# Patient Record
Sex: Male | Born: 1965 | Race: Black or African American | Hispanic: No | Marital: Married | Smoking: Never smoker
Health system: Southern US, Community
[De-identification: ages and names within clinical notes are randomized; demographics above are authoritative.]

## PROBLEM LIST (undated history)

## (undated) DIAGNOSIS — I10 Essential (primary) hypertension: Secondary | ICD-10-CM

## (undated) DIAGNOSIS — T7840XA Allergy, unspecified, initial encounter: Secondary | ICD-10-CM

## (undated) HISTORY — PX: ANKLE SURGERY: SHX546

## (undated) HISTORY — DX: Allergy, unspecified, initial encounter: T78.40XA

## (undated) HISTORY — DX: Essential (primary) hypertension: I10

---

## 2003-07-23 ENCOUNTER — Encounter: Admission: RE | Admit: 2003-07-23 | Discharge: 2003-07-23 | Payer: Self-pay | Admitting: Internal Medicine

## 2004-04-16 ENCOUNTER — Ambulatory Visit (HOSPITAL_COMMUNITY): Admission: RE | Admit: 2004-04-16 | Discharge: 2004-04-16 | Payer: Self-pay | Admitting: Chiropractic Medicine

## 2004-05-13 ENCOUNTER — Ambulatory Visit (HOSPITAL_COMMUNITY): Admission: RE | Admit: 2004-05-13 | Discharge: 2004-05-13 | Payer: Self-pay | Admitting: Chiropractic Medicine

## 2005-08-24 ENCOUNTER — Encounter: Admission: RE | Admit: 2005-08-24 | Discharge: 2005-08-24 | Payer: Self-pay | Admitting: Internal Medicine

## 2014-04-07 ENCOUNTER — Ambulatory Visit (INDEPENDENT_AMBULATORY_CARE_PROVIDER_SITE_OTHER): Payer: BLUE CROSS/BLUE SHIELD | Admitting: Internal Medicine

## 2014-04-07 VITALS — BP 142/84 | HR 65 | Temp 97.8°F | Resp 19 | Ht 76.75 in | Wt 224.8 lb

## 2014-04-07 DIAGNOSIS — I10 Essential (primary) hypertension: Secondary | ICD-10-CM

## 2014-04-07 DIAGNOSIS — H6123 Impacted cerumen, bilateral: Secondary | ICD-10-CM

## 2014-04-07 DIAGNOSIS — H9313 Tinnitus, bilateral: Secondary | ICD-10-CM

## 2014-04-07 NOTE — Progress Notes (Signed)
   Subjective:  This chart was scribed for Ellamae Siaobert Wyndell Cardiff, MD by Haywood PaoNadim Abu Hashem, ED Scribe at [facility].The patient was seen in exam room 02 and the patient's care was started at 1:58 PM.   Patient ID: Casey Salinas, male    DOB: March 22, 1965, 49 y.o.   MRN: [id number] Chief Complaint  Patient presents with  . Tinnitus    for 1 - 2 weeks   HPI  HPI Comments: Casey Salinas is a 49 y.o. male who [facility] complaining of ear ringing onset 1 week ago. The ringing is present in both ears but more so in his left. No fever, cold or hearing loss associated with his ear ringing  Patient Active Problem List   Diagnosis Date Noted  . HTN (hypertension) 04/07/2014   Past Medical History  Diagnosis Date  . Allergy    Past Surgical History  Procedure Laterality Date  . Ankle surgery Left    Allergies no known allergies Prior to Admission medications   Medication Sig Start Date End Date Taking? Authorizing Provider  losartan (COZAAR) 50 MG tablet Take 50 mg by mouth daily.   Yes Historical Provider, MD    Review of Systems  Constitutional: Negative for fever.  HENT: Positive for tinnitus. Negative for hearing loss.        Objective:   Physical Exam  Constitutional: He is oriented to person, place, and time. He appears well-developed and well-nourished. No distress.  HENT:  Head: Normocephalic and atraumatic.  Both auditory canals occluded with cerumen   Eyes: Pupils are equal, round, and reactive to light.  Neck: Normal range of motion.  Cardiovascular: Normal rate and regular rhythm.   Pulmonary/Chest: Effort normal. No respiratory distress.  Musculoskeletal: Normal range of motion.  Neurological: He is alert and oriented to person, place, and time.  Skin: Skin is warm and dry.  Psychiatric: He has a normal mood and affect. His behavior is normal.  Nursing note and vitals reviewed.  Post irrigation his tinnitus has resolved and both canals are  completely clear    Assessment & Plan:  Tinnitus, bilateral  Cerumen impaction, bilateral  Essential hypertension    I have completed the patient encounter in its entirety as documented by the scribe, with editing by me where necessary. Arlyn Buerkle P. Merla Richesoolittle, M.D.

## 2015-08-01 ENCOUNTER — Other Ambulatory Visit: Payer: Self-pay | Admitting: Internal Medicine

## 2015-08-01 ENCOUNTER — Ambulatory Visit
Admission: RE | Admit: 2015-08-01 | Discharge: 2015-08-01 | Disposition: A | Discharge status: Home / Self Care | Payer: BLUE CROSS/BLUE SHIELD | Source: Ambulatory Visit | Attending: Internal Medicine | Admitting: Internal Medicine

## 2015-08-01 DIAGNOSIS — R059 Cough, unspecified: Secondary | ICD-10-CM

## 2015-08-01 DIAGNOSIS — R05 Cough: Secondary | ICD-10-CM

## 2015-09-01 ENCOUNTER — Ambulatory Visit
Admission: RE | Admit: 2015-09-01 | Discharge: 2015-09-01 | Disposition: A | Discharge status: Home / Self Care | Payer: BLUE CROSS/BLUE SHIELD | Source: Ambulatory Visit | Attending: Internal Medicine | Admitting: Internal Medicine

## 2015-09-01 ENCOUNTER — Other Ambulatory Visit: Payer: Self-pay | Admitting: Internal Medicine

## 2015-09-01 DIAGNOSIS — R9389 Abnormal findings on diagnostic imaging of other specified body structures: Secondary | ICD-10-CM

## 2015-11-11 ENCOUNTER — Ambulatory Visit: Payer: BLUE CROSS/BLUE SHIELD | Admitting: Sports Medicine

## 2015-11-12 ENCOUNTER — Encounter: Payer: Self-pay | Admitting: Podiatry

## 2015-11-12 ENCOUNTER — Ambulatory Visit (INDEPENDENT_AMBULATORY_CARE_PROVIDER_SITE_OTHER): Payer: BLUE CROSS/BLUE SHIELD | Admitting: Podiatry

## 2015-11-12 VITALS — BP 149/110 | HR 66

## 2015-11-12 DIAGNOSIS — B351 Tinea unguium: Secondary | ICD-10-CM

## 2015-11-12 DIAGNOSIS — M722 Plantar fascial fibromatosis: Secondary | ICD-10-CM | POA: Diagnosis not present

## 2015-11-12 NOTE — Progress Notes (Signed)
Patient ID: Casey Salinas, male   DOB: Apr 18, 1965, 50 y.o.   MRN: [id number] Subjective: Active 50 year old male presents today for evaluation treatment of what he believes are fungal nails. 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 Patient presents today for evaluation of fungal nails as well as inquiring about custom sport orthotic   Objective: Physical Exam General: The patient is alert and oriented x3 in no acute distress.  Dermatology: Onychodystrophic, hyperkeratotic discolored nails 1 through 5 bilaterally as noted.   Skin is warm, dry and supple bilateral lower extremities. Negative for open lesions or macerations.  Vascular: Palpable pedal pulses bilaterally. No edema or erythema noted. Capillary refill within normal limits.  Neurological: Epicritic and protective threshold grossly intact bilaterally.   Musculoskeletal Exam: Range of motion within normal limits to all pedal and ankle joints bilateral. Muscle strength 5/5 in all groups bilateral.   Hallux valgus deformity is noted to the bilateral bunions more so on the left than the right with hammertoe contractures digits 2 through 5 bilaterally. 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 #2 Today fungal nail biopsy was taken of the nails and sent to pathology. #3 also the patient was scanned for custom molded accommodative sport orthotics #4 discussed conservative versus surgical management of bunion deformities as well as hammertoe  contractures-patient does not want surgery. #5 patient is to return to clinic in 4 weeks     Dr. Felecia ShellingBrent M. Monette Omara, DPM [facility]

## 2015-11-12 NOTE — Progress Notes (Signed)
Patient ID: Casey Salinas, male   DOB: [birth year], 50 y.o.   MRN: [id number]

## 2015-11-15 NOTE — Patient Instructions (Signed)

## 2015-12-15 ENCOUNTER — Ambulatory Visit (INDEPENDENT_AMBULATORY_CARE_PROVIDER_SITE_OTHER): Payer: BLUE CROSS/BLUE SHIELD | Admitting: Podiatry

## 2015-12-15 DIAGNOSIS — Z79899 Other long term (current) drug therapy: Secondary | ICD-10-CM

## 2015-12-15 DIAGNOSIS — B351 Tinea unguium: Secondary | ICD-10-CM | POA: Diagnosis not present

## 2015-12-15 MED ORDER — TERBINAFINE HCL 250 MG PO TABS: 250.0000 mg | ORAL_TABLET | Freq: Every day | ORAL | 2 refills | Status: DC

## 2015-12-16 LAB — HEPATIC FUNCTION PANEL
ALBUMIN: 4.2 g/dL (ref 3.6–5.1)
ALT: 18 U/L (ref 9–46)
AST: 18 U/L (ref 10–35)
Alkaline Phosphatase: 47 U/L (ref 40–115)
BILIRUBIN INDIRECT: 0.5 mg/dL (ref 0.2–1.2)
Bilirubin, Direct: 0.1 mg/dL (ref <=0.2)
TOTAL PROTEIN: 7.1 g/dL (ref 6.1–8.1)
Total Bilirubin: 0.6 mg/dL (ref 0.2–1.2)

## 2015-12-21 NOTE — Progress Notes (Signed)
Subjective: Patient presents today for orthotic pickup as well as evaluation and review of fungal nail biopsy results Patient presents today for possible treatment and evaluation of fungal nails bilaterally 1 through 5. Patient states that the nails have been discolored and thickened for greater than 1 month. Patient presents today for further treatment and evaluation.  Objective: Physical Exam General: The patient is alert and oriented x3 in no acute distress.  Dermatology: Hyperkeratotic, discolored, thickened, onychodystrophy of nails noted bilaterally.  Skin is warm, dry and supple bilateral lower extremities. Negative for open lesions or macerations.  Vascular: Palpable pedal pulses bilaterally. No edema or erythema noted. Capillary refill within normal limits.  Neurological: Epicritic and protective threshold grossly intact bilaterally.   Musculoskeletal Exam: Range of motion within normal limits to all pedal and ankle joints bilateral. Muscle strength 5/5 in all groups bilateral.   Assessment: #1 onychodystrophy bilateral toenails #2 possible onychomycosis #3 hyperkeratotic nails bilateral  Plan of Care:  #1 Patient was evaluated. Fungal results reviewed. #2 Orders for liver function tests were ordered today.  #3 prescription for antifungal Lamisil oral dispensed 3 months  #4 prescription for antifungal nail lacquer dispensed pharmacy #5 patient is to return to clinic in 4 months  Dr. Felecia ShellingBrent M. Evans, DPM [facility]

## 2016-04-12 ENCOUNTER — Ambulatory Visit: Payer: BLUE CROSS/BLUE SHIELD | Admitting: Podiatry

## 2016-04-21 ENCOUNTER — Ambulatory Visit: Payer: BLUE CROSS/BLUE SHIELD | Admitting: Podiatry

## 2017-04-29 IMAGING — CR DG CHEST 2V
2 series · 2 of 2 positions shown · non-contrast
Comparison: 08/01/2015

CLINICAL DATA: Follow-up chest x-ray

EXAM:
CHEST  2 VIEW

[w chest pa]
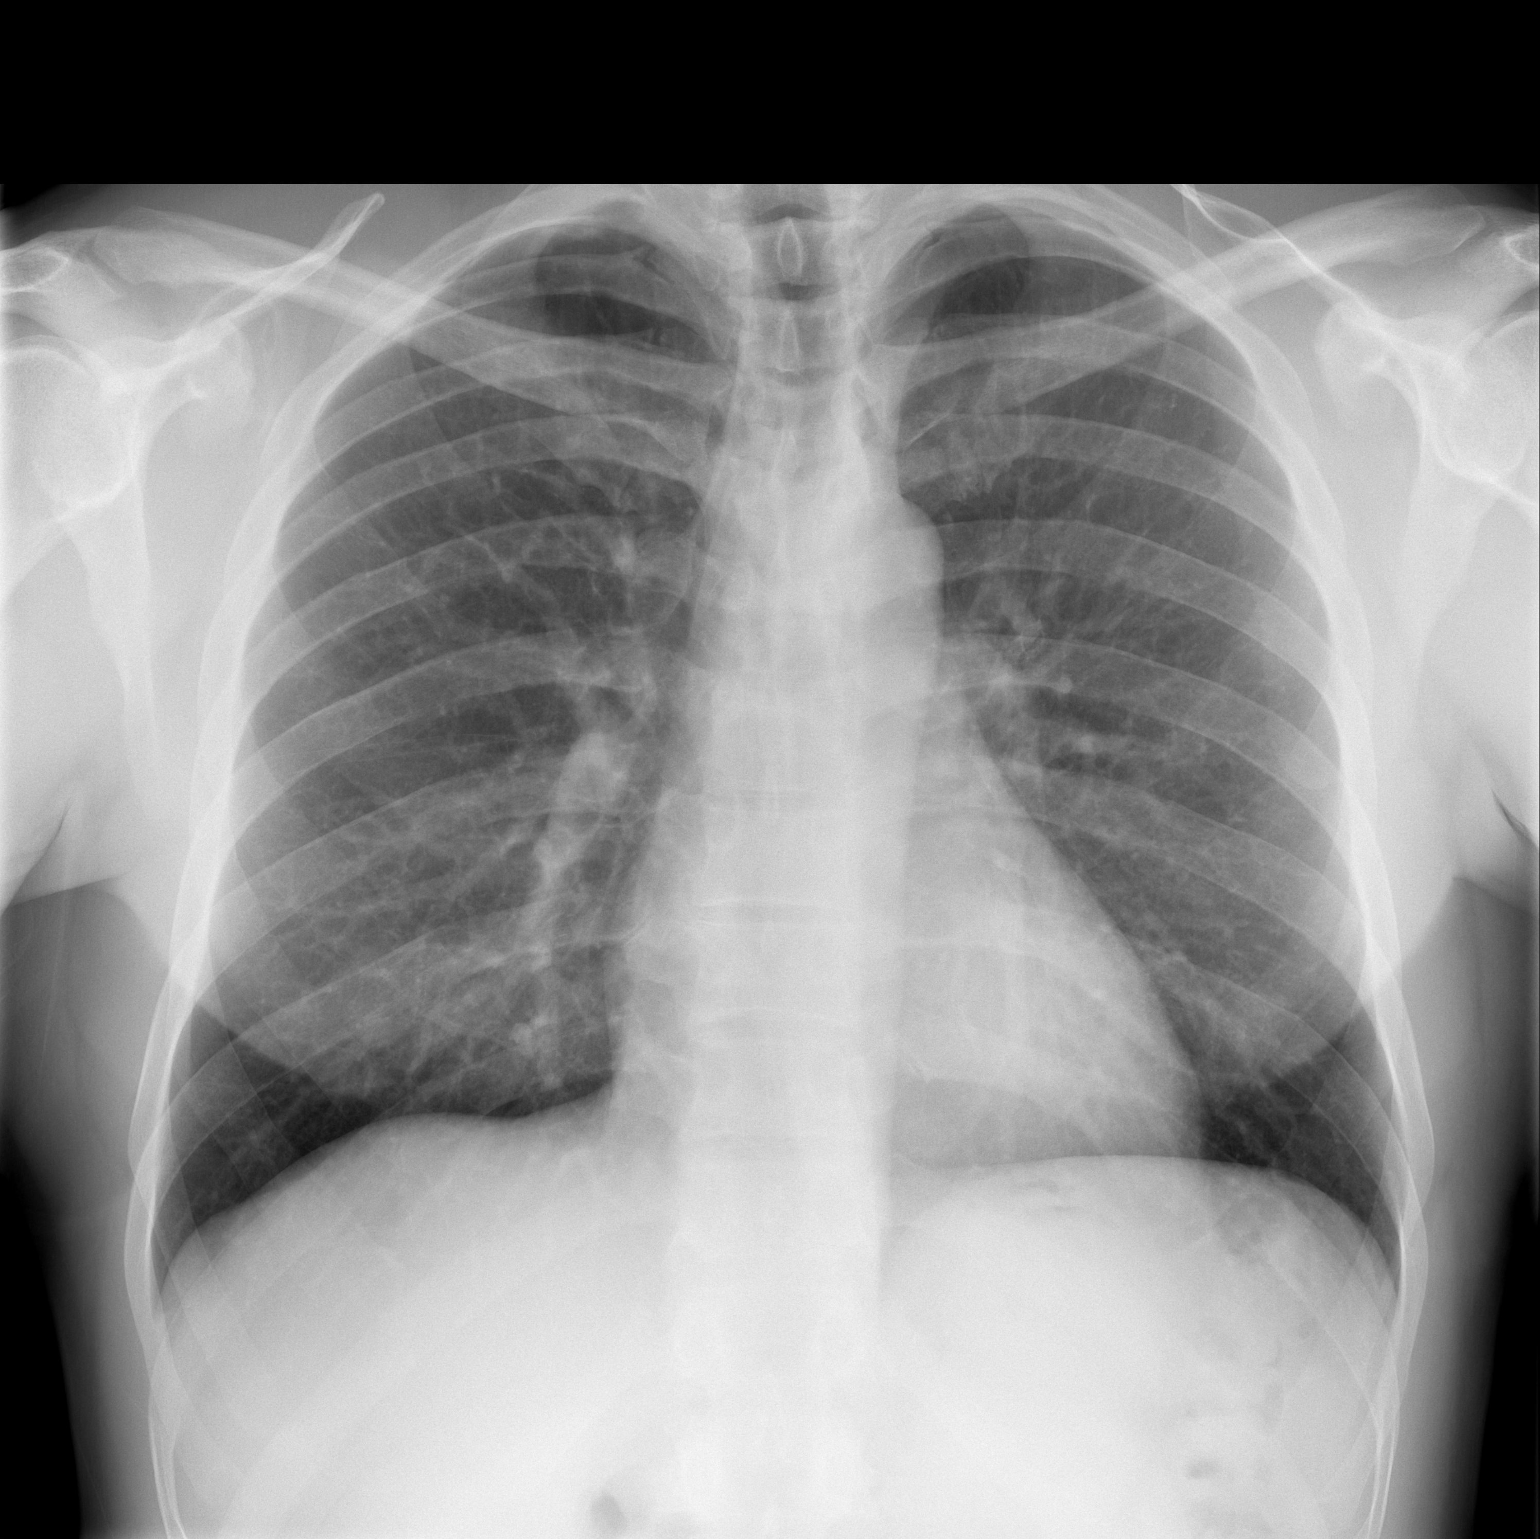

[w chest lat]
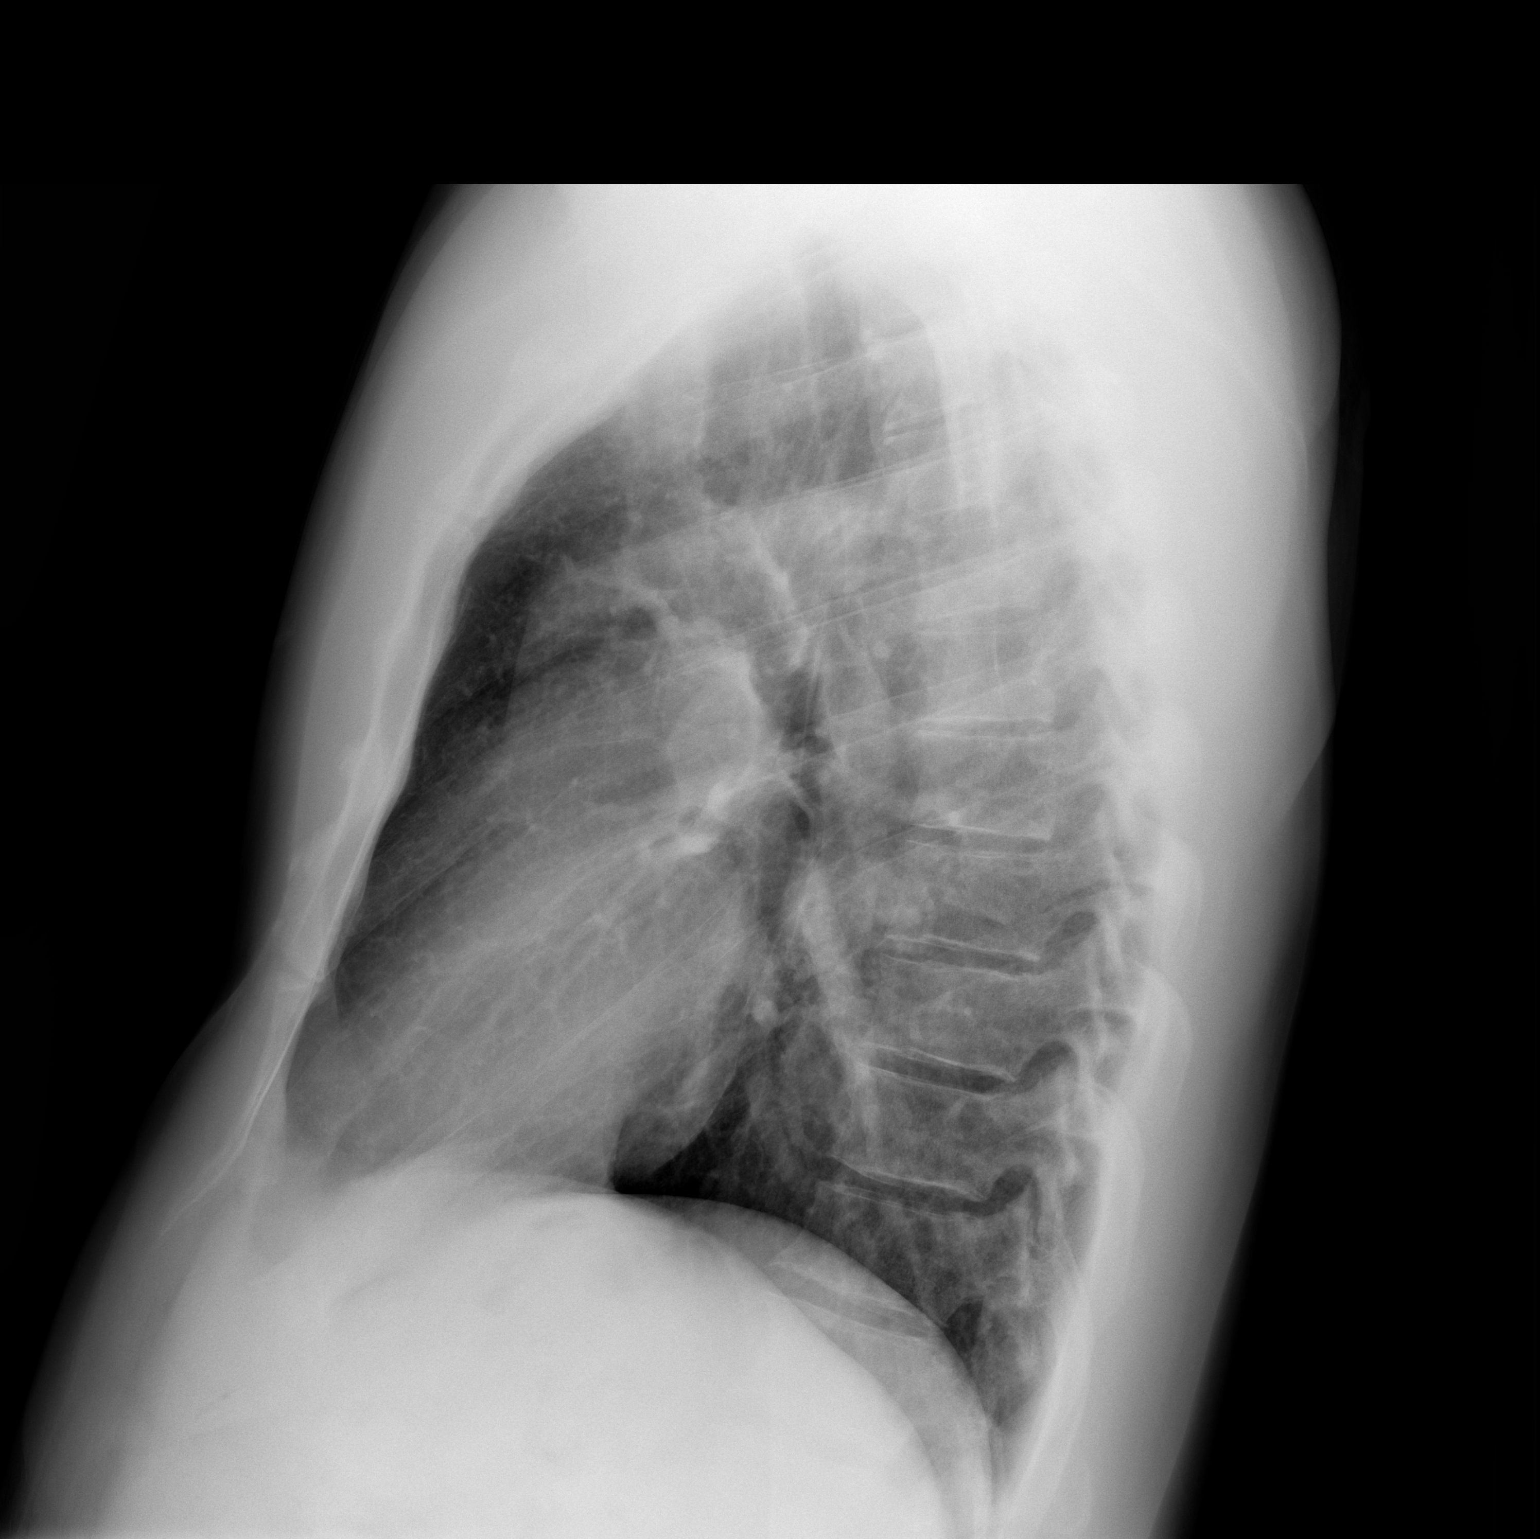

[2 of 2 positions shown; findings below may reference images not displayed]

FINDINGS: The heart size and mediastinal contours are within normal limits.
Both lungs are clear. The visualized skeletal structures are
unremarkable.
IMPRESSION: No active cardiopulmonary disease.

## 2020-06-23 NOTE — Progress Notes (Signed)
Date:  06/26/2020   ID:  Delia Chimes, DOB 09/14/1965, MRN [id number]  PCP:  Willey Blade, MD  Cardiologist:  Rex Kras, DO, [facility] (established care 06/26/2020)  REASON FOR CONSULT: Chest pain  REQUESTING PHYSICIAN:  Willey Blade, MD [address] [place],  [place] [address]  Chief Complaint  Patient presents with  . Chest Pain    HPI  Casey Salinas is a 55 y.o. male who presents to the office with a chief complaint of "chest pain." Patient's past medical history and cardiovascular risk factors include: Hypertension.   He is referred to the office at the request of Willey Blade, MD for evaluation of chest pain.  Patient states that he had an episode of chest pain approximately 3 weeks ago.  Patient states that several days after going to the gym he experienced pleuritic chest pain localized to the left anterior chest wall radiating to his left collarbone.  The symptoms lasted for less than a few minutes, not brought on by effort related activities and did not resolve with rest.  He had mentioned this to his primary care provider and given his other cardiac risk factors was referred to cardiology for further evaluation and management.  Patient's blood pressure at today's office visit are not well controlled.  I asked him what his home blood pressure readings are and patient states that they are usually high and thinks that the blood pressure cuff is not working correctly.  So he goes to the [facility] prior to working out in the morning he checks his blood pressure there and it is usually around 153/83 before exercise and after working out his blood pressures are usually around 120/73.  Patient currently does not have any active chest pain.  He has not had any reoccurrence since that 1 episode.  He is overall euvolemic and not in congestive heart failure.  No family history of premature CAD or sudden cardiac death/cardiomyopathy.  FUNCTIONAL STATUS: Goes  to gym 3-4 x per week for 35mns and does 122m cardio and than resistance training.    ALLERGIES: Allergies  Allergen Reactions  . Shrimp [Shellfish Allergy] Rash    MEDICATION LIST PRIOR TO VISIT: Current Meds  Medication Sig  . amLODipine (NORVASC) 10 MG tablet Take 1 tablet (10 mg total) by mouth daily.  . Cholecalciferol (VITAMIN D3) 250 MCG (10000 UT) capsule Take 10,000 Units by mouth daily.  . Magnesium Bisglycinate 100 MG TABS Take 200 mg by mouth daily.  . Multiple Vitamin (MULTIVITAMIN) tablet Take 1 tablet by mouth daily.  . Marland Kitchenmega-3 acid ethyl esters (LOVAZA) 1 g capsule Take 1 g by mouth daily.  . valsartan (DIOVAN) 160 MG tablet Take 160 mg by mouth daily.     PAST MEDICAL HISTORY: Past Medical History:  Diagnosis Date  . Allergy   . Hypertension     PAST SURGICAL HISTORY: Past Surgical History:  Procedure Laterality Date  . ANKLE SURGERY Left     FAMILY HISTORY: The patient family history includes Cirrhosis in his father; Diabetes in his mother; Hypertension in his father and mother.  SOCIAL HISTORY:  The patient  reports that he has never smoked. He has never used smokeless tobacco. He reports current alcohol use. He reports that he does not use drugs.  REVIEW OF SYSTEMS: Review of Systems  Constitutional: Negative for chills and fever.  HENT: Negative for hoarse voice and nosebleeds.   Eyes: Negative for discharge, double vision and pain.  Cardiovascular: Negative  for chest pain, claudication, dyspnea on exertion, leg swelling, near-syncope, orthopnea, palpitations, paroxysmal nocturnal dyspnea and syncope.  Respiratory: Negative for hemoptysis and shortness of breath.   Musculoskeletal: Negative for muscle cramps and myalgias.  Gastrointestinal: Negative for abdominal pain, constipation, diarrhea, hematemesis, hematochezia, melena, nausea and vomiting.  Neurological: Negative for dizziness and light-headedness.    PHYSICAL EXAM: Vitals with BMI  06/26/2020 06/26/2020 11/12/2015  Height - 6' 6"  -  Weight - 225 lbs -  BMI - 53.61 -  Systolic 443 154 008  Diastolic 90 79 676  Pulse 75 65 66    CONSTITUTIONAL: Well-developed and well-nourished. No acute distress.  SKIN: Skin is warm and dry. No rash noted. No cyanosis. No pallor. No jaundice HEAD: Normocephalic and atraumatic.  EYES: No scleral icterus MOUTH/THROAT: Moist oral membranes.  NECK: No JVD present. No thyromegaly noted. No carotid bruits  LYMPHATIC: No visible cervical adenopathy.  CHEST Normal respiratory effort. No intercostal retractions  LUNGS: Clear to auscultation bilaterally. No stridor. No wheezes. No rales.  CARDIOVASCULAR: Regular rate and rhythm, positive S1-S2, no murmurs rubs or gallops appreciated. ABDOMINAL: No apparent ascites.  EXTREMITIES: No peripheral edema  HEMATOLOGIC: No significant bruising NEUROLOGIC: Oriented to person, place, and time. Nonfocal. Normal muscle tone.  PSYCHIATRIC: Normal mood and affect. Normal behavior. Cooperative  CARDIAC DATABASE: EKG: 06/26/2020: Sinus bradycardia, 58 bpm, normal axis, first-degree AV block, ST changes due to early repolarization.  Echocardiogram: No results found for this or any previous visit from the past 1095 days.    Stress Testing: No results found for this or any previous visit from the past 1095 days.   Heart Catheterization: None  LABORATORY DATA: No flowsheet data found.  CMP Latest Ref Rng & Units 12/15/2015  Total Protein 6.1 - 8.1 g/dL 7.1  Total Bilirubin 0.2 - 1.2 mg/dL 0.6  Alkaline Phos 40 - 115 U/L 47  AST 10 - 35 U/L 18  ALT 9 - 46 U/L 18    Lipid Panel  No results found for: CHOL, TRIG, HDL, CHOLHDL, VLDL, LDLCALC, LDLDIRECT, LABVLDL  No components found for: NTPROBNP No results for input(s): PROBNP in the last 8760 hours. No results for input(s): TSH in the last 8760 hours.  BMP No results for input(s): NA, K, CL, CO2, GLUCOSE, BUN, CREATININE, CALCIUM,  GFRNONAA, GFRAA in the last 8760 hours.  HEMOGLOBIN A1C No results found for: HGBA1C, MPG   External Labs:  Date Collected: 10/03/2019 , information obtained by PCP Potassium: 4.4 Creatinine 1.15 mg/dL. eGFR: 83 mL/min per 1.73 m Hemoglobin: 17.2 g/dL and hematocrit: 52.8 % Lipid profile: Total cholesterol 184 , triglycerides 95 , HDL 64 , LDL 103 AST: 19 , ALT: 20 , alkaline phosphatase: 54  Hemoglobin A1c: 5.7 TSH: 1.810   IMPRESSION:    ICD-10-CM   1. Precordial pain  R07.2 PCV CARDIAC STRESS TEST    PCV ECHOCARDIOGRAM COMPLETE    SARS-COV-2 RNA,(COVID-19) QUAL NAAT    EKG 12-Lead  2. Benign essential hypertension  I10 amLODipine (NORVASC) 10 MG tablet     RECOMMENDATIONS: Casey Salinas is a 55 y.o. male whose past medical history and cardiac risk factors include: Hypertension.  Precordial pain: Patient symptoms of chest pain appears to be noncardiac in etiology. EKG today shows normal sinus rhythm without underlying injury pattern.  He does have concave upsloping ST changes suggestive of early repolarization. No active chest pain or anginal equivalent. Plan echocardiogram to evaluate for structural heart disease. Exercise treadmill stress test to evaluate  for functional status and exercise-induced ischemia.  Benign essential hypertension: Not well controlled. Recommend a systolic blood pressure goal between 120-130 mmHg. Patient does not have an upcoming office visit with his PCP.  Therefore in the interim we will add amlodipine 10 mg p.o. daily until he gets a chance to see his primary provider. Low-salt diet recommended Patient is asked to keep a log of his blood pressures twice a day and to review it with either myself or his PCP  FINAL MEDICATION LIST END OF ENCOUNTER: Meds ordered this encounter  Medications  . amLODipine (NORVASC) 10 MG tablet    Sig: Take 1 tablet (10 mg total) by mouth daily.    Dispense:  90 tablet    Refill:  0    There are no  discontinued medications.   Current Outpatient Medications:  .  amLODipine (NORVASC) 10 MG tablet, Take 1 tablet (10 mg total) by mouth daily., Disp: 90 tablet, Rfl: 0 .  Cholecalciferol (VITAMIN D3) 250 MCG (10000 UT) capsule, Take 10,000 Units by mouth daily., Disp: , Rfl:  .  Magnesium Bisglycinate 100 MG TABS, Take 200 mg by mouth daily., Disp: , Rfl:  .  Multiple Vitamin (MULTIVITAMIN) tablet, Take 1 tablet by mouth daily., Disp: , Rfl:  .  omega-3 acid ethyl esters (LOVAZA) 1 g capsule, Take 1 g by mouth daily., Disp: , Rfl:  .  valsartan (DIOVAN) 160 MG tablet, Take 160 mg by mouth daily., Disp: , Rfl:  .  losartan (COZAAR) 50 MG tablet, Take 50 mg by mouth daily. (Patient not taking: Reported on 06/26/2020), Disp: , Rfl:  .  terbinafine (LAMISIL) 250 MG tablet, Take 1 tablet (250 mg total) by mouth daily. (Patient not taking: Reported on 06/26/2020), Disp: 30 tablet, Rfl: 2  Orders Placed This Encounter  Procedures  . SARS-COV-2 RNA,(COVID-19) QUAL NAAT  . PCV CARDIAC STRESS TEST  . EKG 12-Lead  . PCV ECHOCARDIOGRAM COMPLETE    There are no Patient Instructions on file for this visit.   --Continue cardiac medications as reconciled in final medication list. --Return in about 4 weeks (around 07/24/2020) for Follow up, BP. Or sooner if needed. --Continue follow-up with your primary care physician regarding the management of your other chronic comorbid conditions.  Patient's questions and concerns were addressed to his satisfaction. He voices understanding of the instructions provided during this encounter.   This note was created using a voice recognition software as a result there may be grammatical errors inadvertently enclosed that do not reflect the nature of this encounter. Every attempt is made to correct such errors.  Rex Kras, [place], [facility]  Pager: [phone] Office: [phone]

## 2020-06-26 ENCOUNTER — Encounter: Payer: Self-pay | Admitting: Cardiology

## 2020-06-26 ENCOUNTER — Ambulatory Visit: Payer: 59 | Admitting: Cardiology

## 2020-06-26 VITALS — BP 155/90 | HR 75 | Temp 97.9°F | Resp 11 | Ht 78.0 in | Wt 225.0 lb

## 2020-06-26 DIAGNOSIS — I10 Essential (primary) hypertension: Secondary | ICD-10-CM

## 2020-06-26 DIAGNOSIS — R072 Precordial pain: Secondary | ICD-10-CM

## 2020-06-26 MED ORDER — AMLODIPINE BESYLATE 10 MG PO TABS: 10.0000 mg | ORAL_TABLET | Freq: Every day | ORAL | 0 refills | Status: DC

## 2020-06-27 ENCOUNTER — Other Ambulatory Visit: Payer: Self-pay | Admitting: Cardiology

## 2020-06-27 DIAGNOSIS — I10 Essential (primary) hypertension: Secondary | ICD-10-CM

## 2020-07-09 ENCOUNTER — Other Ambulatory Visit (HOSPITAL_COMMUNITY)
Admission: RE | Admit: 2020-07-09 | Discharge: 2020-07-09 | Disposition: A | Discharge status: Home / Self Care | Payer: Self-pay | Source: Ambulatory Visit | Attending: Cardiology | Admitting: Cardiology

## 2020-07-09 DIAGNOSIS — Z20822 Contact with and (suspected) exposure to covid-19: Secondary | ICD-10-CM | POA: Insufficient documentation

## 2020-07-09 DIAGNOSIS — Z01812 Encounter for preprocedural laboratory examination: Secondary | ICD-10-CM | POA: Insufficient documentation

## 2020-07-09 LAB — SARS CORONAVIRUS 2 (TAT 6-24 HRS): SARS Coronavirus 2: NEGATIVE

## 2020-07-11 ENCOUNTER — Ambulatory Visit: Payer: 59

## 2020-07-11 ENCOUNTER — Other Ambulatory Visit: Payer: Self-pay

## 2020-07-11 DIAGNOSIS — R072 Precordial pain: Secondary | ICD-10-CM

## 2020-07-14 ENCOUNTER — Ambulatory Visit: Payer: 59

## 2020-07-14 ENCOUNTER — Other Ambulatory Visit: Payer: Self-pay

## 2020-07-14 DIAGNOSIS — R072 Precordial pain: Secondary | ICD-10-CM

## 2020-07-17 NOTE — Progress Notes (Signed)
Called and spoke with patient regarding his Echocardiogram results.

## 2020-08-18 ENCOUNTER — Ambulatory Visit: Payer: 59 | Admitting: Cardiology

## 2020-08-18 ENCOUNTER — Other Ambulatory Visit: Payer: Self-pay

## 2020-08-18 ENCOUNTER — Encounter: Payer: Self-pay | Admitting: Cardiology

## 2020-08-18 VITALS — BP 136/72 | HR 83 | Temp 98.3°F | Resp 16 | Ht 78.0 in | Wt 222.0 lb

## 2020-08-18 DIAGNOSIS — R072 Precordial pain: Secondary | ICD-10-CM

## 2020-08-18 DIAGNOSIS — R9439 Abnormal result of other cardiovascular function study: Secondary | ICD-10-CM

## 2020-08-18 DIAGNOSIS — I10 Essential (primary) hypertension: Secondary | ICD-10-CM

## 2020-08-18 NOTE — Progress Notes (Signed)
ID:  Casey Salinas, DOB Jul 09, 1965, MRN [id number]  PCP:  Willey Blade, MD  Cardiologist:  Rex Kras, DO, [facility] (established care 06/26/2020)  Date: 08/18/2020 Last Office Visit: 06/26/2020  Chief Complaint  Patient presents with   Precordial pain   Follow-up    HPI  Casey Salinas is a 55 y.o. male who presents to the office with a chief complaint of "reevaluation of chest pain and review test results" Patient's past medical history and cardiovascular risk factors include: Hypertension.   He is referred to the office at the request of Willey Blade, MD for evaluation of chest pain.  He was referred to the office for evaluation of precordial pain which appeared to be noncardiac in etiology.  And therefore was recommended to undergo an echocardiogram and GXT for further risk stratification.  Echocardiogram notes preserved LVEF, normal diastolic filling pattern, and no significant valvular heart disease.  GXT noted good functional capacity for age but overall equivocal and intermediate risk study.  Clinically, patient states that he does not have chest pain at rest or with effort related activities.  He is overall euvolemic and not in congestive heart failure.  No family history of premature CAD or sudden cardiac death/cardiomyopathy.   FUNCTIONAL STATUS: Goes to gym 3-4 x per week for 73mns and does 135m cardio and than resistance training.    ALLERGIES: Allergies  Allergen Reactions   Shrimp [Shellfish Allergy] Rash    MEDICATION LIST PRIOR TO VISIT: Current Meds  Medication Sig   amLODipine (NORVASC) 10 MG tablet TAKE 1 TABLET(10 MG) BY MOUTH DAILY   cetirizine (ZYRTEC) 10 MG tablet Take 1 tablet by mouth as needed.   Cholecalciferol (VITAMIN D3) 250 MCG (10000 UT) capsule Take 10,000 Units by mouth daily.   ibuprofen (ADVIL) 600 MG tablet Take 1 tablet by mouth as needed.   Magnesium Bisglycinate 100 MG TABS Take 200 mg by mouth daily.   Multiple  Vitamin (MULTIVITAMIN) tablet Take 1 tablet by mouth daily.   omega-3 acid ethyl esters (LOVAZA) 1 g capsule Take 1 g by mouth daily.   valsartan (DIOVAN) 160 MG tablet Take 160 mg by mouth daily.     PAST MEDICAL HISTORY: Past Medical History:  Diagnosis Date   Allergy    Hypertension     PAST SURGICAL HISTORY: Past Surgical History:  Procedure Laterality Date   ANKLE SURGERY Left     FAMILY HISTORY: The patient family history includes Cirrhosis in his father; Diabetes in his mother; Hypertension in his father and mother.  SOCIAL HISTORY:  The patient  reports that he has never smoked. He has never used smokeless tobacco. He reports current alcohol use. He reports that he does not use drugs.  REVIEW OF SYSTEMS: Review of Systems  Constitutional: Negative for chills and fever.  HENT:  Negative for hoarse voice and nosebleeds.   Eyes:  Negative for discharge, double vision and pain.  Cardiovascular:  Negative for chest pain, claudication, dyspnea on exertion, leg swelling, near-syncope, orthopnea, palpitations, paroxysmal nocturnal dyspnea and syncope.  Respiratory:  Negative for hemoptysis and shortness of breath.   Musculoskeletal:  Negative for muscle cramps and myalgias.  Gastrointestinal:  Negative for abdominal pain, constipation, diarrhea, hematemesis, hematochezia, melena, nausea and vomiting.  Neurological:  Negative for dizziness and light-headedness.   PHYSICAL EXAM: Vitals with BMI 08/18/2020 06/26/2020 06/26/2020  Height _0  - _1   Weight 222 lbs - 225 lbs  BMI 2575.91 2663.84Systolic 136655993  676  Diastolic 72 90 79  Pulse 83 75 65    CONSTITUTIONAL: Well-developed and well-nourished. No acute distress.  SKIN: Skin is warm and dry. No rash noted. No cyanosis. No pallor. No jaundice HEAD: Normocephalic and atraumatic.  EYES: No scleral icterus MOUTH/THROAT: Moist oral membranes.  NECK: No JVD present. No thyromegaly noted. No carotid bruits  LYMPHATIC:  No visible cervical adenopathy.  CHEST Normal respiratory effort. No intercostal retractions  LUNGS: Clear to auscultation bilaterally. No stridor. No wheezes. No rales.  CARDIOVASCULAR: Regular rate and rhythm, positive S1-S2, no murmurs rubs or gallops appreciated. ABDOMINAL: No apparent ascites.  EXTREMITIES: No peripheral edema  HEMATOLOGIC: No significant bruising NEUROLOGIC: Oriented to person, place, and time. Nonfocal. Normal muscle tone.  PSYCHIATRIC: Normal mood and affect. Normal behavior. Cooperative  CARDIAC DATABASE: EKG: 06/26/2020: Sinus bradycardia, 58 bpm, normal axis, first-degree AV block, ST changes due to early repolarization.  Echocardiogram: 07/14/2020: Left ventricle cavity is normal in size and wall thickness. Normal global wall motion. Normal LV systolic function with EF 60%. Normal diastolic filling pattern.  No significant valvular abnormality. No evidence of pulmonary hypertension.   Stress Testing: Exercise treadmill stress test 07/11/2020: Functional status: Good. Chest pain: No. Reason for stopping exercise: fatigue  / weakness.  Hypertensive response to exercise: No. Exercised for 09 minutes 02 seconds on Bruce protocol, achieved 10.18 METS, and 95% of APMHR.  Stress ECG equivocal for ischemia (stress ECG notes 63m horizontal ST depression in inferior leads at peak rest which resolves by 2 minutes into recovery).   Intermediate risk study- clinical correlation is requested.   Heart Catheterization: None  LABORATORY DATA: No flowsheet data found.  CMP Latest Ref Rng & Units 12/15/2015  Total Protein 6.1 - 8.1 g/dL 7.1  Total Bilirubin 0.2 - 1.2 mg/dL 0.6  Alkaline Phos 40 - 115 U/L 47  AST 10 - 35 U/L 18  ALT 9 - 46 U/L 18    Lipid Panel  No results found for: CHOL, TRIG, HDL, CHOLHDL, VLDL, LDLCALC, LDLDIRECT, LABVLDL  No components found for: NTPROBNP No results for input(s): PROBNP in the last 8760 hours. No results for input(s): TSH  in the last 8760 hours.  BMP No results for input(s): NA, K, CL, CO2, GLUCOSE, BUN, CREATININE, CALCIUM, GFRNONAA, GFRAA in the last 8760 hours.  HEMOGLOBIN A1C No results found for: HGBA1C, MPG   External Labs:  Date Collected: 10/03/2019 , information obtained by PCP Potassium: 4.4 Creatinine 1.15 mg/dL. eGFR: 83 mL/min per 1.73 m Hemoglobin: 17.2 g/dL and hematocrit: 52.8 % Lipid profile: Total cholesterol 184 , triglycerides 95 , HDL 64 , LDL 103 AST: 19 , ALT: 20 , alkaline phosphatase: 54  Hemoglobin A1c: 5.7 TSH: 1.810   IMPRESSION:    ICD-10-CM   1. Equivocal stress test  R94.39 PCV MYOCARDIAL PERFUSION WO LEXISCAN    2. Precordial pain  R07.2     3. Benign essential hypertension  I10        RECOMMENDATIONS: Casey SCHLIEPis a 55y.o. male whose past medical history and cardiac risk factors include: Hypertension.  Given his symptoms of precordial pain he underwent echocardiogram and GXT.  Results reviewed with him in great detail and noted above for further reference.  The GXT study was reported to be an intermediate risk due to stress ECG being equivocal.  We discussed continuing to improve his modifiable cardiovascular risk factors and to reevaluate if his symptoms of precordial discomfort resurfaces.  However, patient states that  he rather undergo additional testing to make sure he does not have CAD.  We discussed exercise nuclear stress test versus coronary CTA.  Patient would like to proceed with exercise nuclear stress test at this time.  Patient's blood pressure at today's office visit is better compared to prior visits.  Medications reconciled.  Recommend a systolic blood pressure goal of approximately 120 mmHg.  Currently managed by primary care provider.  Low-salt diet recommended.  FINAL MEDICATION LIST END OF ENCOUNTER: No orders of the defined types were placed in this encounter.   Medications Discontinued During This Encounter  Medication Reason    losartan (COZAAR) 50 MG tablet Error   terbinafine (LAMISIL) 250 MG tablet Error     Current Outpatient Medications:    amLODipine (NORVASC) 10 MG tablet, TAKE 1 TABLET(10 MG) BY MOUTH DAILY, Disp: 90 tablet, Rfl: 0   cetirizine (ZYRTEC) 10 MG tablet, Take 1 tablet by mouth as needed., Disp: , Rfl:    Cholecalciferol (VITAMIN D3) 250 MCG (10000 UT) capsule, Take 10,000 Units by mouth daily., Disp: , Rfl:    ibuprofen (ADVIL) 600 MG tablet, Take 1 tablet by mouth as needed., Disp: , Rfl:    Magnesium Bisglycinate 100 MG TABS, Take 200 mg by mouth daily., Disp: , Rfl:    Multiple Vitamin (MULTIVITAMIN) tablet, Take 1 tablet by mouth daily., Disp: , Rfl:    omega-3 acid ethyl esters (LOVAZA) 1 g capsule, Take 1 g by mouth daily., Disp: , Rfl:    valsartan (DIOVAN) 160 MG tablet, Take 160 mg by mouth daily., Disp: , Rfl:   Orders Placed This Encounter  Procedures   PCV MYOCARDIAL PERFUSION WO LEXISCAN    There are no Patient Instructions on file for this visit.   --Continue cardiac medications as reconciled in final medication list. --Return in about 1 year (around 08/18/2021). Or sooner if needed. --Continue follow-up with your primary care physician regarding the management of your other chronic comorbid conditions.  Patient's questions and concerns were addressed to his satisfaction. He voices understanding of the instructions provided during this encounter.   This note was created using a voice recognition software as a result there may be grammatical errors inadvertently enclosed that do not reflect the nature of this encounter. Every attempt is made to correct such errors.  Rex Kras, [place], [facility]  Pager: [phone] Office: [phone]

## 2020-09-15 ENCOUNTER — Ambulatory Visit: Payer: 59

## 2020-09-15 ENCOUNTER — Other Ambulatory Visit: Payer: Self-pay

## 2020-09-15 DIAGNOSIS — R9439 Abnormal result of other cardiovascular function study: Secondary | ICD-10-CM

## 2020-09-19 NOTE — Progress Notes (Signed)
Called and spoke with patient regarding his stress test results.

## 2020-10-08 ENCOUNTER — Other Ambulatory Visit: Payer: Self-pay | Admitting: Cardiology

## 2020-10-08 DIAGNOSIS — I10 Essential (primary) hypertension: Secondary | ICD-10-CM

## 2021-08-17 ENCOUNTER — Ambulatory Visit: Payer: 59 | Admitting: Cardiology

## 2021-08-24 ENCOUNTER — Ambulatory Visit: Payer: 59 | Admitting: Cardiology

## 2021-08-24 ENCOUNTER — Encounter: Payer: Self-pay | Admitting: Cardiology

## 2021-08-24 VITALS — BP 131/83 | HR 86 | Temp 98.2°F | Resp 17 | Ht 78.0 in | Wt 225.8 lb

## 2021-08-24 DIAGNOSIS — R072 Precordial pain: Secondary | ICD-10-CM

## 2021-08-24 DIAGNOSIS — I10 Essential (primary) hypertension: Secondary | ICD-10-CM

## 2022-02-15 ENCOUNTER — Ambulatory Visit: Payer: 59 | Admitting: Podiatry

## 2022-02-15 ENCOUNTER — Encounter: Payer: Self-pay | Admitting: Podiatry

## 2022-02-15 DIAGNOSIS — L600 Ingrowing nail: Secondary | ICD-10-CM | POA: Diagnosis not present

## 2022-02-15 DIAGNOSIS — M779 Enthesopathy, unspecified: Secondary | ICD-10-CM | POA: Diagnosis not present

## 2022-02-15 DIAGNOSIS — M21619 Bunion of unspecified foot: Secondary | ICD-10-CM | POA: Diagnosis not present

## 2022-02-15 NOTE — Progress Notes (Signed)
Patient presents stating he is subjective:   Patient ID: Casey Salinas, male   DOB: 56 y.o.   MRN: [id number]   HPI Generalized foot pain bilateral flatfeet thinks may be an ingrown toenail left and bunion deformity left over right   Review of Systems  All other systems reviewed and are negative.       Objective:  Physical Exam Vitals and nursing note reviewed.  Constitutional:      Appearance: He is well-developed.  Pulmonary:     Effort: Pulmonary effort is normal.  Musculoskeletal:        General: Normal range of motion.  Skin:    General: Skin is warm.  Neurological:     Mental Status: He is alert.     Neurovascular status intact with significant flattening of the arch bilateral structural bunion left over right with redness and pain and probability for low-grade ingrown toenail left hallux lateral border     Assessment:  Tendon and his condition creating stress on the feet along with inflammation and ingrown toenail with moderate structural bunion left     Plan:  H&P reviewed discussed treatment options recommended at this time orthotics to lift up the arches due to the intense flatfoot deformity wants this done and sterile casting was done tolerated well reappoint for [place] to recheck again when orthotics return all questions answered do not recommend current correction of ingrown toenail bunion but may be necessary in future

## 2022-02-18 ENCOUNTER — Ambulatory Visit: Payer: 59 | Admitting: Podiatry

## 2022-03-29 ENCOUNTER — Ambulatory Visit (INDEPENDENT_AMBULATORY_CARE_PROVIDER_SITE_OTHER): Payer: 59

## 2022-03-29 DIAGNOSIS — M7751 Other enthesopathy of right foot: Secondary | ICD-10-CM

## 2022-03-29 DIAGNOSIS — M7752 Other enthesopathy of left foot: Secondary | ICD-10-CM

## 2022-03-29 DIAGNOSIS — M779 Enthesopathy, unspecified: Secondary | ICD-10-CM

## 2022-03-29 NOTE — Progress Notes (Signed)
Patient presents today to pick up custom molded foot orthotics recommended by Dr. Paulla Dolly.   Orthotics were dispensed and fit was satisfactory. Reviewed instructions for break-in and wear. Written instructions given to patient.  Patient will follow up as needed.   [facility] - order # [id number]

## 2022-04-30 ENCOUNTER — Ambulatory Visit (INDEPENDENT_AMBULATORY_CARE_PROVIDER_SITE_OTHER): Payer: 59

## 2022-04-30 DIAGNOSIS — M779 Enthesopathy, unspecified: Secondary | ICD-10-CM

## 2022-04-30 NOTE — Progress Notes (Signed)
Patient presents today to pick up custom molded foot orthotics recommended by Dr. Paulla Dolly.   Orthotics were dispensed and fit was satisfactory. Reviewed instructions for break-in and wear. Written instructions given to patient.  Patient will follow up as needed.

## 2022-06-15 ENCOUNTER — Ambulatory Visit (INDEPENDENT_AMBULATORY_CARE_PROVIDER_SITE_OTHER): Payer: 59

## 2022-06-15 DIAGNOSIS — M779 Enthesopathy, unspecified: Secondary | ICD-10-CM

## 2022-06-15 NOTE — Progress Notes (Signed)
Patient presents to the office today with issues concerning their custom inserts. Patient states that the inserts are too wide for his shoes. Advised patient that I would send them back and have them made to a medium width instead of wide as shown on the work order.   Will send orthotics to lab for adjustments.  Advised patient that she will receive a call from the office to come in for a fitting.

## 2022-07-06 ENCOUNTER — Ambulatory Visit (INDEPENDENT_AMBULATORY_CARE_PROVIDER_SITE_OTHER): Payer: 59

## 2022-07-06 DIAGNOSIS — M779 Enthesopathy, unspecified: Secondary | ICD-10-CM

## 2022-07-06 NOTE — Progress Notes (Signed)
Patient presents today to pick up custom molded foot orthotics recommended by Dr. REGAL.   Orthotics were dispensed and fit was satisfactory. Reviewed instructions for break-in and wear. Written instructions given to patient.  Patient will follow up as needed.
